# Patient Record
Sex: Male | Born: 1980 | Race: Black or African American | Hispanic: No | Marital: Single | Smoking: Current every day smoker
Health system: Southern US, Community
[De-identification: ages and names within clinical notes are randomized; demographics above are authoritative.]

## PROBLEM LIST (undated history)

## (undated) DIAGNOSIS — G35 Multiple sclerosis: Secondary | ICD-10-CM

## (undated) HISTORY — PX: APPENDECTOMY: SHX54

---

## 2006-12-01 ENCOUNTER — Emergency Department: Payer: Self-pay | Admitting: Emergency Medicine

## 2011-12-23 ENCOUNTER — Emergency Department: Payer: Self-pay | Admitting: Emergency Medicine

## 2013-02-11 ENCOUNTER — Emergency Department: Payer: Self-pay | Admitting: Emergency Medicine

## 2013-02-11 LAB — COMPREHENSIVE METABOLIC PANEL
Albumin: 4.3 g/dL (ref 3.4–5.0)
BUN: 12 mg/dL (ref 7–18)
Chloride: 108 mmol/L — ABNORMAL HIGH (ref 98–107)
Creatinine: 0.87 mg/dL (ref 0.60–1.30)
EGFR (Non-African Amer.): >60
Glucose: 82 mg/dL (ref 65–99)
Osmolality: 282 (ref 275–301)
Sodium: 142 mmol/L (ref 136–145)
Total Protein: 7.6 g/dL (ref 6.4–8.2)

## 2013-02-11 LAB — URINALYSIS, COMPLETE
Glucose,UR: NEGATIVE mg/dL (ref 0–75)
Leukocyte Esterase: NEGATIVE
Protein: NEGATIVE
RBC,UR: 1 /HPF (ref 0–5)
Specific Gravity: 1.026 (ref 1.003–1.030)
Squamous Epithelial: <1
WBC UR: 2 /HPF (ref 0–5)

## 2013-02-11 LAB — CBC
MCH: 30.1 pg (ref 26.0–34.0)
MCHC: 34.2 g/dL (ref 32.0–36.0)
MCV: 88 fL (ref 80–100)
Platelet: 249 10*3/uL (ref 150–440)
RBC: 4.94 10*6/uL (ref 4.40–5.90)
RDW: 13.2 % (ref 11.5–14.5)

## 2013-02-11 LAB — LIPASE, BLOOD: Lipase: 80 U/L (ref 73–393)

## 2013-10-18 LAB — CBC WITH DIFFERENTIAL/PLATELET
Basophil #: 0 10*3/uL (ref 0.0–0.1)
Basophil %: 0.3 %
Eosinophil #: 0.1 10*3/uL (ref 0.0–0.7)
Eosinophil %: 0.9 %
HCT: 39.5 % — ABNORMAL LOW (ref 40.0–52.0)
HGB: 13.6 g/dL (ref 13.0–18.0)
Lymphocyte #: 1.6 10*3/uL (ref 1.0–3.6)
Lymphocyte %: 14.5 %
MCH: 30.3 pg (ref 26.0–34.0)
MCHC: 34.3 g/dL (ref 32.0–36.0)
MCV: 88 fL (ref 80–100)
MONO ABS: 0.6 x10 3/mm (ref 0.2–1.0)
Monocyte %: 5.3 %
Neutrophil #: 8.6 10*3/uL — ABNORMAL HIGH (ref 1.4–6.5)
Neutrophil %: 79 %
Platelet: 224 10*3/uL (ref 150–440)
RBC: 4.48 10*6/uL (ref 4.40–5.90)
RDW: 13 % (ref 11.5–14.5)
WBC: 10.9 10*3/uL — AB (ref 3.8–10.6)

## 2013-10-18 LAB — COMPREHENSIVE METABOLIC PANEL
ALBUMIN: 3.8 g/dL (ref 3.4–5.0)
ALK PHOS: 61 U/L
ANION GAP: 7 (ref 7–16)
BUN: 15 mg/dL (ref 7–18)
Bilirubin,Total: 0.3 mg/dL (ref 0.2–1.0)
CHLORIDE: 103 mmol/L (ref 98–107)
CREATININE: 0.92 mg/dL (ref 0.60–1.30)
Calcium, Total: 8.2 mg/dL — ABNORMAL LOW (ref 8.5–10.1)
Co2: 22 mmol/L (ref 21–32)
EGFR (African American): >60
EGFR (Non-African Amer.): >60
Glucose: 195 mg/dL — ABNORMAL HIGH (ref 65–99)
OSMOLALITY: 271 (ref 275–301)
Potassium: 3.4 mmol/L — ABNORMAL LOW (ref 3.5–5.1)
SGOT(AST): 22 U/L (ref 15–37)
SGPT (ALT): 29 U/L (ref 12–78)
Sodium: 132 mmol/L — ABNORMAL LOW (ref 136–145)
Total Protein: 6.9 g/dL (ref 6.4–8.2)

## 2013-10-18 LAB — LIPASE, BLOOD: Lipase: 75 U/L (ref 73–393)

## 2013-10-19 ENCOUNTER — Observation Stay: Payer: Self-pay | Admitting: Surgery

## 2013-10-19 LAB — URINALYSIS, COMPLETE
BILIRUBIN, UR: NEGATIVE
Bacteria: NONE SEEN
Blood: NEGATIVE
Glucose,UR: NEGATIVE mg/dL (ref 0–75)
Ketone: NEGATIVE
Leukocyte Esterase: NEGATIVE
Nitrite: NEGATIVE
PH: 6 (ref 4.5–8.0)
PROTEIN: NEGATIVE
RBC,UR: NONE SEEN /HPF (ref 0–5)
SPECIFIC GRAVITY: 1.009 (ref 1.003–1.030)
Squamous Epithelial: <1
WBC UR: <1 /HPF (ref 0–5)

## 2013-10-20 LAB — CBC WITH DIFFERENTIAL/PLATELET
Basophil #: 0 10*3/uL (ref 0.0–0.1)
Basophil %: 0.1 %
EOS PCT: 0.1 %
Eosinophil #: 0 10*3/uL (ref 0.0–0.7)
HCT: 36.9 % — ABNORMAL LOW (ref 40.0–52.0)
HGB: 12.9 g/dL — ABNORMAL LOW (ref 13.0–18.0)
Lymphocyte #: 2.5 10*3/uL (ref 1.0–3.6)
Lymphocyte %: 13.1 %
MCH: 30.3 pg (ref 26.0–34.0)
MCHC: 34.8 g/dL (ref 32.0–36.0)
MCV: 87 fL (ref 80–100)
Monocyte #: 1.5 x10 3/mm — ABNORMAL HIGH (ref 0.2–1.0)
Monocyte %: 7.6 %
NEUTROS PCT: 79.1 %
Neutrophil #: 15.4 10*3/uL — ABNORMAL HIGH (ref 1.4–6.5)
Platelet: 211 10*3/uL (ref 150–440)
RBC: 4.24 10*6/uL — AB (ref 4.40–5.90)
RDW: 13.1 % (ref 11.5–14.5)
WBC: 19.4 10*3/uL — ABNORMAL HIGH (ref 3.8–10.6)

## 2013-10-24 LAB — PATHOLOGY REPORT

## 2015-01-31 NOTE — H&P (Signed)
Subjective/Chief Complaint 24 hours of abdominal pain   History of Present Illness 34 y/o male awoke Friday am with vague periumbilical pain followed by nausea, no emesis and migration of pain into RLQ.  no anorexia, no sick contacts, no fevers but feels chills.  In April 2014 had similar but less intense symptoms and CT scan done at [facility] showed dilated appendix and his pain at that time resolved in 3-4 days following antibiotics.   Past History None   Past Med/Surgical Hx:  None, patient reports no medical history.:   ALLERGIES:  No Known Allergies:    Medications takes no presecription meds,  occasional ibuprofen.   Family and Social History:  Family History Non-Contributory   Social History positive  tobacco, positive tobacco (Greater than 1 year), negative ETOH   + Tobacco Current (within 1 year)   Place of Living Home   Review of Systems:  Subjective/Chief Complaint see above   Fever/Chills Yes   Cough No   Sputum No   Abdominal Pain Yes   Diarrhea No   Constipation No   Nausea/Vomiting Yes   Physical Exam:  GEN no acute distress, temp 98 p 77 125/65 BMI 27.8   HEENT pale conjunctivae, PERRL   NECK supple   RESP normal resp effort  clear BS   CARD regular rate   ABD positive tenderness  no liver/spleen enlargement  no hernia  soft  rovsings sign positive, focal peritoneal signs over RLQ.   LYMPH negative neck   EXTR negative cyanosis/clubbing   SKIN normal to palpation   NEURO cranial nerves intact   PSYCH A+O to time, place, person   Lab Results:  Hepatic:  09-Jan-15 22:16   Bilirubin, Total 0.3  Alkaline Phosphatase 61 (45-117 NOTE: New Reference Range 08/30/13)  SGPT (ALT) 29  SGOT (AST) 22  Total Protein, Serum 6.9  Albumin, Serum 3.8  Routine Chem:  09-Jan-15 22:16   Glucose, Serum  195  BUN 15  Creatinine (comp) 0.92  Sodium, Serum  132  Potassium, Serum  3.4  Chloride, Serum 103  CO2, Serum 22  Calcium (Total), Serum   8.2  Osmolality (calc) 271  eGFR (African American) >60  eGFR (Non-African American) >60 (eGFR values <60mL/min/1.73 m2 may be an indication of chronic kidney disease (CKD). Calculated eGFR is useful in patients with stable renal function. The eGFR calculation will not be reliable in acutely ill patients when serum creatinine is changing rapidly. It is not useful in  patients on dialysis. The eGFR calculation may not be applicable to patients at the low and high extremes of body sizes, pregnant women, and vegetarians.)  Anion Gap 7  Lipase 75 (Result(s) reported on 18 Oct 2013 at 10:45PM.)  Routine UA:  10-Jan-15 02:56   Color (UA) Straw  Clarity (UA) Clear  Glucose (UA) Negative  Bilirubin (UA) Negative  Ketones (UA) Negative  Specific Gravity (UA) 1.009  Blood (UA) Negative  pH (UA) 6.0  Protein (UA) Negative  Nitrite (UA) Negative  Leukocyte Esterase (UA) Negative (Result(s) reported on 19 Oct 2013 at 04:25AM.)  RBC (UA) NONE SEEN  WBC (UA) <1 /HPF  Bacteria (UA) NONE SEEN  Epithelial Cells (UA) <1 /HPF  Mucous (UA) PRESENT (Result(s) reported on 19 Oct 2013 at 04:25AM.)  Routine Hem:  09-Jan-15 22:16   WBC (CBC)  10.9  RBC (CBC) 4.48  Hemoglobin (CBC) 13.6  Hematocrit (CBC)  39.5  Platelet Count (CBC) 224  MCV 88  MCH 30.3  MCHC  34.3  RDW 13.0  Neutrophil % 79.0  Lymphocyte % 14.5  Monocyte % 5.3  Eosinophil % 0.9  Basophil % 0.3  Neutrophil #  8.6  Lymphocyte # 1.6  Monocyte # 0.6  Eosinophil # 0.1  Basophil # 0.0 (Result(s) reported on 18 Oct 2013 at 10:32PM.)   Radiology Results: LabUnknown:    10-Jan-15 04:00, CT Abdomen and Pelvis With Contrast  PACS Image  CT:  CT Abdomen and Pelvis With Contrast  REASON FOR EXAM:    (1) rlq pain, tender x 24 hrs; (2) rlq pain, tender x   24 hrs;    NOTE: Nursing t  COMMENTS:   May transport without cardiac monitor    PROCEDURE: CT  - CT ABDOMEN / PELVIS  W  - Oct 19 2013  4:00AM     CLINICAL DATA:  Right  lower quadrant pain and tenderness for 24 hr.    EXAM:  CT ABDOMEN AND PELVIS WITH CONTRAST    TECHNIQUE:  Multidetector CT imaging of the abdomen and pelvis was performed  using the standard protocol following bolus administration of  intravenous contrast.  CONTRAST:  125 cc Isovue 370.    COMPARISON:  None.    FINDINGS:  Mild dependent atelectasis is seen in the right lung base. No  pleural or pericardial effusion.    The gallbladder, liver, spleen, adrenal glands, pancreas and kidneys  are unremarkable.    The appendix is dilated with periappendiceal inflammatory change and  an appendicolith with consistent with acute appendicitis. No abscess  identified. The stomach, small bowel and colon appear normal. No  lymphadenopathy or fluid is identified. There is no focal bony  abnormality.     IMPRESSION:  Study is positive for acute appendicitis without abscess.    Critical Value/emergent results were called by telephone at the time  of interpretation on 10/19/2013 at 4:02 AM to Dr. Carrie Mew ,  who verbally acknowledged these results.      Electronically Signed    By: Inge Rise M.D.    On: 10/19/2013 04:04       Verified By: Ramond Dial, M.D.,    Assessment/Admission Diagnosis 34 y/o male with acute appendicitis   Plan admit, laparoscopic appendectomy later this am.  Explained procedure and that Dr Burt Knack will assume his care later this am. all questions addressed.   Electronic Signatures: Sherri Rad (MD)  (Signed 10-Jan-15 06:30)  Authored: CHIEF COMPLAINT and HISTORY, PAST MEDICAL/SURGIAL HISTORY, ALLERGIES, HOME MEDICATIONS, OTHER MEDICATIONS, FAMILY AND SOCIAL HISTORY, REVIEW OF SYSTEMS, PHYSICAL EXAM, LABS, Radiology, ASSESSMENT AND PLAN   Last Updated: 10-Jan-15 06:30 by Sherri Rad (MD)

## 2015-01-31 NOTE — Op Note (Signed)
PATIENT NAME:  Dennis Cruz, Dennis Cruz MR#:  [id number] DATE OF BIRTH:  09-16-1981  DATE OF PROCEDURE:  10/19/2013  PREOPERATIVE DIAGNOSIS: Acute appendicitis.   POSTOPERATIVE DIAGNOSIS: Acute gangrenous appendicitis.   PROCEDURE: Laparoscopic appendectomy.   SURGEON: Richard E. Excell Seltzer, MD  ANESTHESIA: General with endotracheal tube.   INDICATIONS: This is a patient with right lower quadrant pain and tenderness with a workup showing appendicitis. Preoperatively, we discussed the rationale for surgery, the options of observation, risk of bleeding, infection, recurrence of symptoms, failure to resolve his symptoms, open procedure and negative laparoscopy. This was reviewed for him. He understood and agreed to proceed.   FINDINGS: Acute appendicitis at the tip, gangrenous, but not ruptured.   DESCRIPTION OF PROCEDURE: The patient was induced to general anesthesia, given IV antibiotics. VTE prophylaxis was in place. He was prepped and draped in a sterile fashion. Marcaine was infiltrated in skin and subcutaneous tissues around the periumbilical area. An incision was made. Veress needle was placed. Pneumoperitoneum was obtained, and a 5 mm trocar port was placed. The abdominal cavity was explored, and under direct vision, a 12 mm left lateral port site was placed as well as a 5 mm suprapubic port. The appendix was identified in the right lower quadrant, elevated. The base of the appendix was handled with an Endo GIA standard load, and then 2 firings of the vascular load Endo GIA were fired across the mesoappendix, and the specimen passed out through the lateral port site with the aid of an Endo Catch bag. The area was irrigated with copious amounts of normal saline. Hemostasis was adequate. There was no sign of bleeding or bowel injury. Therefore, the left lateral port site was closed with multiple simple sutures of 0 Vicryl, utilizing an Endo Close technique. Again, the hemostasis was adequate. Pneumoperitoneum  was released. All ports were removed. The 4-0 subcuticular Monocryl was used on all skin edges. Steri-Strips, Mastisol and sterile dressings were placed.   The patient tolerated the procedure well. There were no complications. He was taken to the recovery room in stable condition to be admitted for continued care.   ____________________________ Adah Salvage. Excell Seltzer, MD rec:lb D: 10/19/2013 11:16:21 ET T: 10/19/2013 11:40:47 ET JOB#: [id number]  cc: Adah Salvage. Excell Seltzer, MD, <Dictator> Lattie Haw MD ELECTRONICALLY SIGNED 10/23/2013 11:46

## 2015-07-01 ENCOUNTER — Emergency Department
Admission: EM | Admit: 2015-07-01 | Discharge: 2015-07-01 | Disposition: A | Discharge status: Home / Self Care | Payer: 59 | Attending: Emergency Medicine | Admitting: Emergency Medicine

## 2015-07-01 DIAGNOSIS — M62830 Muscle spasm of back: Secondary | ICD-10-CM | POA: Diagnosis not present

## 2015-07-01 DIAGNOSIS — J029 Acute pharyngitis, unspecified: Secondary | ICD-10-CM | POA: Insufficient documentation

## 2015-07-01 DIAGNOSIS — H9201 Otalgia, right ear: Secondary | ICD-10-CM | POA: Diagnosis not present

## 2015-07-01 LAB — POCT RAPID STREP A: Streptococcus, Group A Screen (Direct): NEGATIVE

## 2015-07-01 MED ORDER — MELOXICAM 15 MG PO TABS: 15.0000 mg | ORAL_TABLET | Freq: Every day | ORAL | Status: AC

## 2015-07-01 MED ORDER — CYCLOBENZAPRINE HCL 10 MG PO TABS: 10.0000 mg | ORAL_TABLET | Freq: Three times a day (TID) | ORAL | Status: DC | PRN

## 2015-07-01 MED ORDER — MAGIC MOUTHWASH W/LIDOCAINE: 5.0000 mL | Freq: Four times a day (QID) | ORAL | Status: AC

## 2015-07-01 NOTE — Discharge Instructions (Signed)
Sore Throat    A sore throat is pain, burning, irritation, or scratchiness of the throat. There is often pain or tenderness when swallowing or talking. A sore throat may be accompanied by other symptoms, such as coughing, sneezing, fever, and swollen neck glands. A sore throat is often the first sign of another sickness, such as a cold, flu, strep throat, or mononucleosis (commonly known as mono). Most sore throats go away without medical treatment.  CAUSES  The most common causes of a sore throat include:  A viral infection, such as a cold, flu, or mono.  A bacterial infection, such as strep throat, tonsillitis, or whooping cough.  Seasonal allergies.  Dryness in the air.  Irritants, such as smoke or pollution.  Gastroesophageal reflux disease (GERD). HOME CARE INSTRUCTIONS  Only take over-the-counter medicines as directed by your caregiver.  Drink enough fluids to keep your urine clear or pale yellow.  Rest as needed.  Try using throat sprays, lozenges, or sucking on hard candy to ease any pain (if older than 4 years or as directed).  Sip warm liquids, such as broth, herbal tea, or warm water with honey to relieve pain temporarily. You may also eat or drink cold or frozen liquids such as frozen ice pops.  Gargle with salt water (mix 1 tsp salt with 8 oz of water).  Do not smoke and avoid secondhand smoke.  Put a cool-mist humidifier in your bedroom at night to moisten the air. You can also turn on a hot shower and sit in the bathroom with the door closed for 5-10 minutes. SEEK IMMEDIATE MEDICAL CARE IF:  You have difficulty breathing.  You are unable to swallow fluids, soft foods, or your saliva.  You have increased swelling in the throat.  Your sore throat does not get better in 7 days.  You have nausea and vomiting.  You have a fever or persistent symptoms for more than 2-3 days.  You have a fever and your symptoms suddenly get worse. MAKE SURE YOU:  Understand these instructions.    Will watch your condition.  Will get help right away if you are not doing well or get worse. Document Released: 11/03/2004 Document Revised: 09/12/2012 Document Reviewed: 06/03/2012  [facility] Patient Information 2015 [place], [place]. This information is not intended to replace advice given to you by your health care provider. Make sure you discuss any questions you have with your health care provider.   Back Exercises Back exercises help treat and prevent back injuries. The goal of back exercises is to increase the strength of your abdominal and back muscles and the flexibility of your back. These exercises should be started when you no longer have back pain. Back exercises include:  Pelvic Tilt. Lie on your back with your knees bent. Tilt your pelvis until the lower part of your back is against the floor. Hold this position 5 to 10 sec and repeat 5 to 10 times.  Knee to Chest. Pull first 1 knee up against your chest and hold for 20 to 30 seconds, repeat this with the other knee, and then both knees. This may be done with the other leg straight or bent, whichever feels better.  Sit-Ups or Curl-Ups. Bend your knees 90 degrees. Start with tilting your pelvis, and do a partial, slow sit-up, lifting your trunk only 30 to 45 degrees off the floor. Take at least 2 to 3 seconds for each sit-up. Do not do sit-ups with your knees out straight. If partial  sit-ups are difficult, simply do the above but with only tightening your abdominal muscles and holding it as directed.  Hip-Lift. Lie on your back with your knees flexed 90 degrees. Push down with your feet and shoulders as you raise your hips a couple inches off the floor; hold for 10 seconds, repeat 5 to 10 times.  Back arches. Lie on your stomach, propping yourself up on bent elbows. Slowly press on your hands, causing an arch in your low back. Repeat 3 to 5 times. Any initial stiffness and discomfort should lessen with repetition over  time.  Shoulder-Lifts. Lie face down with arms beside your body. Keep hips and torso pressed to floor as you slowly lift your head and shoulders off the floor. Do not overdo your exercises, especially in the beginning. Exercises may cause you some mild back discomfort which lasts for a few minutes; however, if the pain is more severe, or lasts for more than 15 minutes, do not continue exercises until you see your caregiver. Improvement with exercise therapy for back problems is slow.  See your caregivers for assistance with developing a proper back exercise program. Document Released: 11/03/2004 Document Revised: 12/19/2011 Document Reviewed: 07/28/2011 [facility] Patient Information 2015 [place], [place]. This information is not intended to replace advice given to you by your health care provider. Make sure you discuss any questions you have with your health care provider.

## 2015-07-01 NOTE — ED Provider Notes (Signed)
[facility] Emergency Department Provider Note  ____________________________________________  Time seen: Approximately 6:06 PM  I have reviewed the triage vital signs and the nursing notes.   HISTORY  Chief Complaint Sore Throat and Back Pain    HPI Dennis Cruz is a 34 y.o. male sensitivity emergency room with a complaint of 3 days of sore throat and 2 days of lower back pain. He states that the throat pain is described as sharp, constant, and is moderate to severe. He endorses chills but denies fever. No nasal congestion, mild right ear pain, and no cough. He has taken ibuprofen for same minimal relief. He also endorses low back pain bilaterally. Denies any kind of injury. Pain has gradually worsened over the preceding days. No numbness or tingling in lower extremities. The pain does not radiate.   No past medical history on file.  There are no active problems to display for this patient.   No past surgical history on file.  Current Outpatient Rx  Name  Route  Sig  Dispense  Refill  . cyclobenzaprine (FLEXERIL) 10 MG tablet   Oral   Take 1 tablet (10 mg total) by mouth 3 (three) times daily as needed for muscle spasms.   15 tablet   0   . magic mouthwash w/lidocaine SOLN   Oral   Take 5 mLs by mouth 4 (four) times daily.   240 mL   0     1/1/1 on ingredient ratio   . meloxicam (MOBIC) 15 MG tablet   Oral   Take 1 tablet (15 mg total) by mouth daily.   30 tablet   0     Allergies Review of patient's allergies indicates no known allergies.  No family history on file.  Social History Social History  Substance Use Topics  . Smoking status: Not on file  . Smokeless tobacco: Not on file  . Alcohol Use: Not on file    Review of Systems Constitutional: No fever but endorses chills Eyes: No visual changes. ENT: Positive sore throat, mild right ear pain, and no nasal congestion. Difficulty swallowing. Cardiovascular: Denies chest  pain. Respiratory: Denies shortness of breath. Gastrointestinal: No abdominal pain.  No nausea, no vomiting.  No diarrhea.  No constipation. Genitourinary: Negative for dysuria. Musculoskeletal: Negative for back pain. Skin: Negative for rash. Neurological: Negative for headaches, focal weakness or numbness.  10-point ROS otherwise negative.  ____________________________________________   PHYSICAL EXAM:  VITAL SIGNS: ED Triage Vitals  Enc Vitals Group     BP 07/01/15 1710 137/75 mmHg     Pulse Rate 07/01/15 1710 74     Resp 07/01/15 1710 18     Temp 07/01/15 1710 98 F (36.7 C)     Temp Source 07/01/15 1710 Oral     SpO2 07/01/15 1710 97 %     Weight 07/01/15 1710 212 lb (96.163 kg)     Height 07/01/15 1710  (1.88 m)     Head Cir --      Peak Flow --      Pain Score 07/01/15 1711 10     Pain Loc --      Pain Edu? --      Excl. in GC? --     Constitutional: Alert and oriented. Well appearing and in no acute distress. Eyes: Conjunctivae are normal. PERRL. EOMI. Head: Atraumatic. Nose: No congestion/rhinnorhea. Mouth/Throat: Mucous membranes are moist.  Oropharynx non-erythematous. Neck: No stridor.   Hematological/Lymphatic/Immunilogical: Positive for diffuse anterior  cervical lymphadenopathy. Tender to palpation. Mobile Cardiovascular: Normal rate, regular rhythm. Grossly normal heart sounds.  Good peripheral circulation. Respiratory: Normal respiratory effort.  No retractions. Lungs CTAB. Gastrointestinal: Soft and nontender. No distention. No abdominal bruits. No CVA tenderness. Musculoskeletal: No lower extremity tenderness nor edema.  No joint effusions. No visible abnormalities. No tenderness to midline. Diffuse tenderness bilateral paraspinal muscles. Spasms noted in right paraspinal lumbar muscles. Negative straight leg raise. Neurologic:  Normal speech and language. No gross focal neurologic deficits are appreciated. No gait instability. Skin:  Skin is warm,  dry and intact. No rash noted. Psychiatric: Mood and affect are normal. Speech and behavior are normal.  ____________________________________________   LABS (all labs ordered are listed, but only abnormal results are displayed)  Labs Reviewed  POCT RAPID STREP A    Negative rapid strep ____________________________________________  EKG   ____________________________________________  RADIOLOGY   ____________________________________________   PROCEDURES  Procedure(s) performed: None  Critical Care performed: No  ____________________________________________   INITIAL IMPRESSION / ASSESSMENT AND PLAN / ED COURSE  Pertinent labs & imaging results that were available during my care of the patient were reviewed by me and considered in my medical decision making (see chart for details).  Patient presented to the ED with complaint of sore throat and low back pain. Patient's symptoms, history, and exam are most consistent with pharyngitis and lumbar paraspinal muscle spasm. Discussed with patient lab results and diagnosis. He was understanding. States compliance with treatment regimen. ____________________________________________   FINAL CLINICAL IMPRESSION(S) / ED DIAGNOSES  Final diagnoses:  Pharyngitis  Lumbar paraspinal muscle spasm      Racheal Patches, PA-C 07/01/15 1857  Sharyn Creamer, MD 07/01/15 2217

## 2015-07-01 NOTE — ED Notes (Signed)
Pt ambulatory to triage with reports of sore throat without fever since Sunday and lower back pain that began on Monday without injury.

## 2015-09-29 ENCOUNTER — Encounter: Payer: Self-pay | Admitting: Emergency Medicine

## 2015-09-29 ENCOUNTER — Emergency Department: Payer: 59

## 2015-09-29 ENCOUNTER — Emergency Department
Admission: EM | Admit: 2015-09-29 | Discharge: 2015-09-29 | Disposition: A | Discharge status: Home / Self Care | Payer: 59 | Attending: Emergency Medicine | Admitting: Emergency Medicine

## 2015-09-29 DIAGNOSIS — S39012A Strain of muscle, fascia and tendon of lower back, initial encounter: Secondary | ICD-10-CM

## 2015-09-29 DIAGNOSIS — Y9289 Other specified places as the place of occurrence of the external cause: Secondary | ICD-10-CM | POA: Diagnosis not present

## 2015-09-29 DIAGNOSIS — X58XXXA Exposure to other specified factors, initial encounter: Secondary | ICD-10-CM | POA: Insufficient documentation

## 2015-09-29 DIAGNOSIS — Y998 Other external cause status: Secondary | ICD-10-CM | POA: Diagnosis not present

## 2015-09-29 DIAGNOSIS — F17200 Nicotine dependence, unspecified, uncomplicated: Secondary | ICD-10-CM | POA: Diagnosis not present

## 2015-09-29 DIAGNOSIS — Z79899 Other long term (current) drug therapy: Secondary | ICD-10-CM | POA: Diagnosis not present

## 2015-09-29 DIAGNOSIS — Y9389 Activity, other specified: Secondary | ICD-10-CM | POA: Insufficient documentation

## 2015-09-29 DIAGNOSIS — S3992XA Unspecified injury of lower back, initial encounter: Secondary | ICD-10-CM | POA: Diagnosis present

## 2015-09-29 MED ORDER — NAPROXEN 500 MG PO TABS: 500.0000 mg | ORAL_TABLET | Freq: Two times a day (BID) | ORAL | Status: AC

## 2015-09-29 MED ORDER — CYCLOBENZAPRINE HCL 10 MG PO TABS: 10.0000 mg | ORAL_TABLET | Freq: Every day | ORAL | Status: AC

## 2015-09-29 NOTE — ED Notes (Signed)
Discussed discharge instructions, prescriptions, and follow-up care with patient. No questions or concerns at this time. Pt stable at discharge.  

## 2015-09-29 NOTE — ED Provider Notes (Signed)
CSN: [id number]     Arrival date & time 09/29/15  1436 History   First MD Initiated Contact with Patient 09/29/15 1449     Chief Complaint  Patient presents with  . Back Pain      HPI Comments: 34 year old male presents today complaining of low back pain for the past 3 days. He does not recall an injury. No pain, tingling or numbness down his legs. He has been taking motrin without relief of pain. Was seen for the same about 3 months ago and prescribe mobic and flexeril, no imaging was performed. No history of low back problems or back surgery. No loss of bowel or bladder function.   The history is provided by the patient.    History reviewed. No pertinent past medical history. History reviewed. No pertinent past surgical history. History reviewed. No pertinent family history. Social History  Substance Use Topics  . Smoking status: Current Every Day Smoker  . Smokeless tobacco: None  . Alcohol Use: Yes    Review of Systems  Musculoskeletal: Positive for back pain and arthralgias. Negative for myalgias and gait problem.  Neurological: Negative for weakness and numbness.  All other systems reviewed and are negative.     Allergies  Review of patient's allergies indicates no known allergies.  Home Medications   Prior to Admission medications   Medication Sig Start Date End Date Taking? Authorizing Provider  cyclobenzaprine (FLEXERIL) 10 MG tablet Take 1 tablet (10 mg total) by mouth at bedtime. 09/29/15   Christella Scheuermann, PA-C  magic mouthwash w/lidocaine SOLN Take 5 mLs by mouth 4 (four) times daily. 07/01/15   Delorise Royals Cuthriell, PA-C  meloxicam (MOBIC) 15 MG tablet Take 1 tablet (15 mg total) by mouth daily. 07/01/15   Delorise Royals Cuthriell, PA-C  naproxen (NAPROSYN) 500 MG tablet Take 1 tablet (500 mg total) by mouth 2 (two) times daily with a meal. 09/29/15   Christella Scheuermann, PA-C   BP 126/73 mmHg  Pulse 64  Temp(Src) 98.2 F (36.8 C) (Oral)  Resp 20  Ht 6\' 1"  (1.854  m)  Wt 96.163 kg  BMI 27.98 kg/m2  SpO2 98% Physical Exam  Constitutional: He is oriented to person, place, and time. Vital signs are normal. He appears well-developed and well-nourished. He is active.  Non-toxic appearance. He does not have a sickly appearance. He does not appear ill.  HENT:  Head: Normocephalic and atraumatic.  Musculoskeletal: Normal range of motion. He exhibits tenderness.  Neurological: He is alert and oriented to person, place, and time. He has normal strength. He displays abnormal reflex. No sensory deficit. He exhibits normal muscle tone. Gait normal.  Reflex Scores:      Patellar reflexes are 2+ on the right side and 2+ on the left side. Bilateral LE strength 5/5   Skin: Skin is warm and dry.  Psychiatric: He has a normal mood and affect. His behavior is normal. Judgment and thought content normal.  Nursing note and vitals reviewed.   ED Course  Procedures (including critical care time) Labs Review Labs Reviewed - No data to display  Imaging Review Dg Lumbar Spine Complete  09/29/2015  CLINICAL DATA:  Lumbago for 2 days with intermittent left-sided radicular symptoms EXAM: LUMBAR SPINE - COMPLETE 4+ VIEW COMPARISON:  None. FINDINGS: Frontal, lateral, spot lumbosacral lateral, and bilateral oblique views were obtained. The there are 5 non-rib-bearing lumbar type vertebral bodies. There is slight thoracolumbar dextroscoliosis. There is no fracture or spondylolisthesis. Disc  spaces appear normal. There is no appreciable facet osteoarthritic arthropathy on the oblique views. IMPRESSION: Slight scoliosis. No fracture or spondylolisthesis. No appreciable arthropathy. Electronically Signed   By: Bretta Bang III M.D.   On: 09/29/2015 15:57   I have personally reviewed and evaluated these images and lab results as part of my medical decision-making.   EKG Interpretation None      I independently reviewed and interpreted lumbar spine XRAY, no evidence of  fracture or degenerative change  MDM  Flexeril and Napoxen given to patient Advised to establish with a PCP for followup, may need physical therapy Final diagnoses:  Lumbar strain, initial encounter        Christella Scheuermann, PA-C 09/29/15 1600  Emily Filbert, MD 09/30/15 [phone]

## 2015-09-29 NOTE — Discharge Instructions (Signed)

## 2015-09-29 NOTE — ED Notes (Addendum)
Pt to ed with c/o lower back pain since Sunday, denies injury.  Denies difficulty or pain with urination.  Pt alert and oriented. Reports pain worse with movement.

## 2016-09-29 IMAGING — CR DG LUMBAR SPINE COMPLETE 4+V
5 series · 5 of 5 positions shown · non-contrast
Comparison: None.

CLINICAL DATA: Lumbago for 2 days with intermittent left-sided
radicular symptoms

EXAM:
LUMBAR SPINE - COMPLETE 4+ VIEW

[l-spine ap]
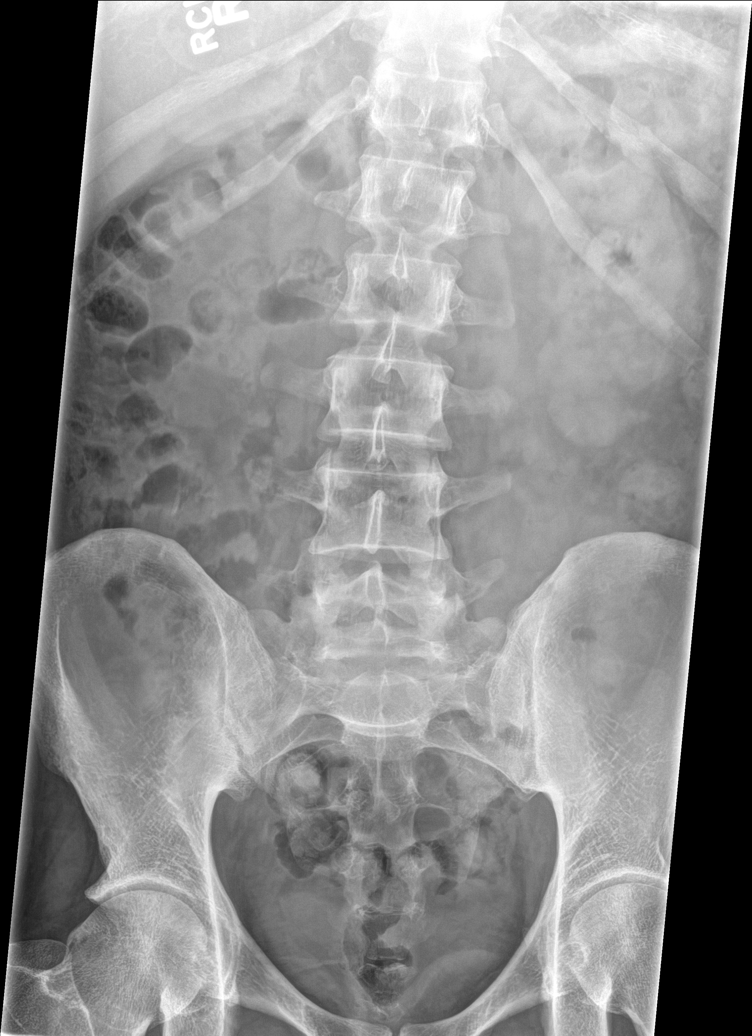

[l-spine obl (1 of 2)]
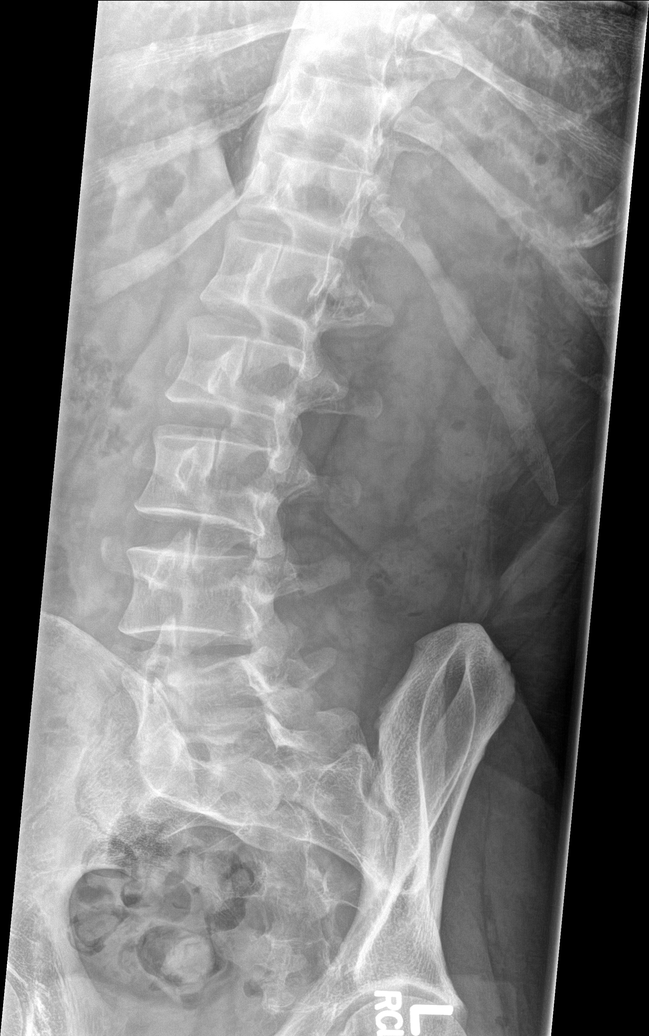

[l-spine obl (2 of 2)]
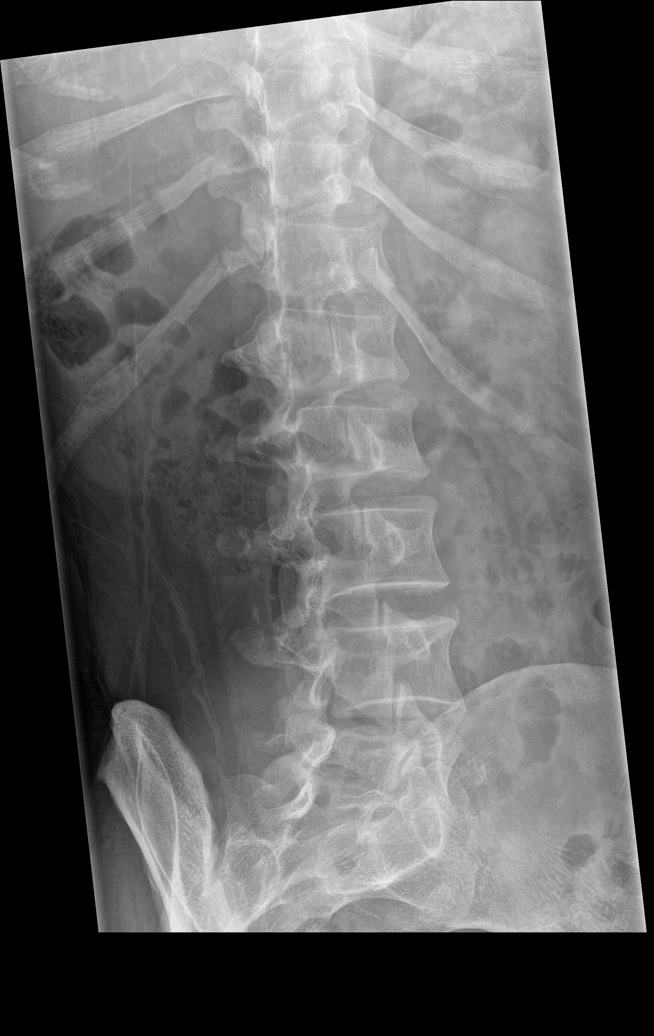

[l-spine lat]
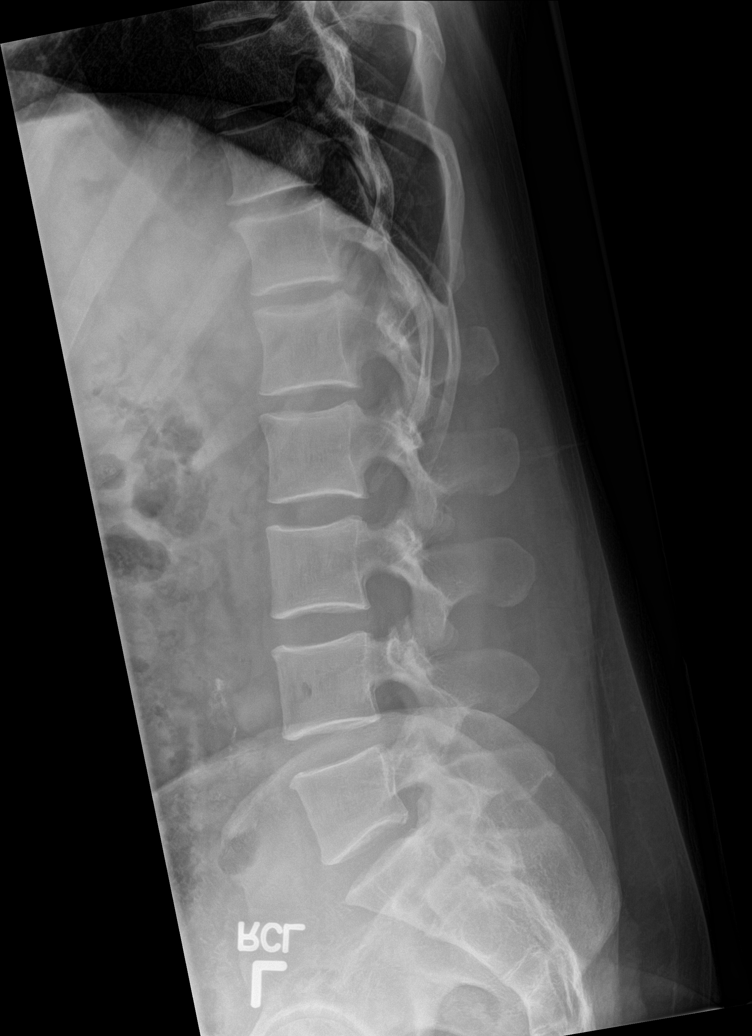

[l-spine spot]
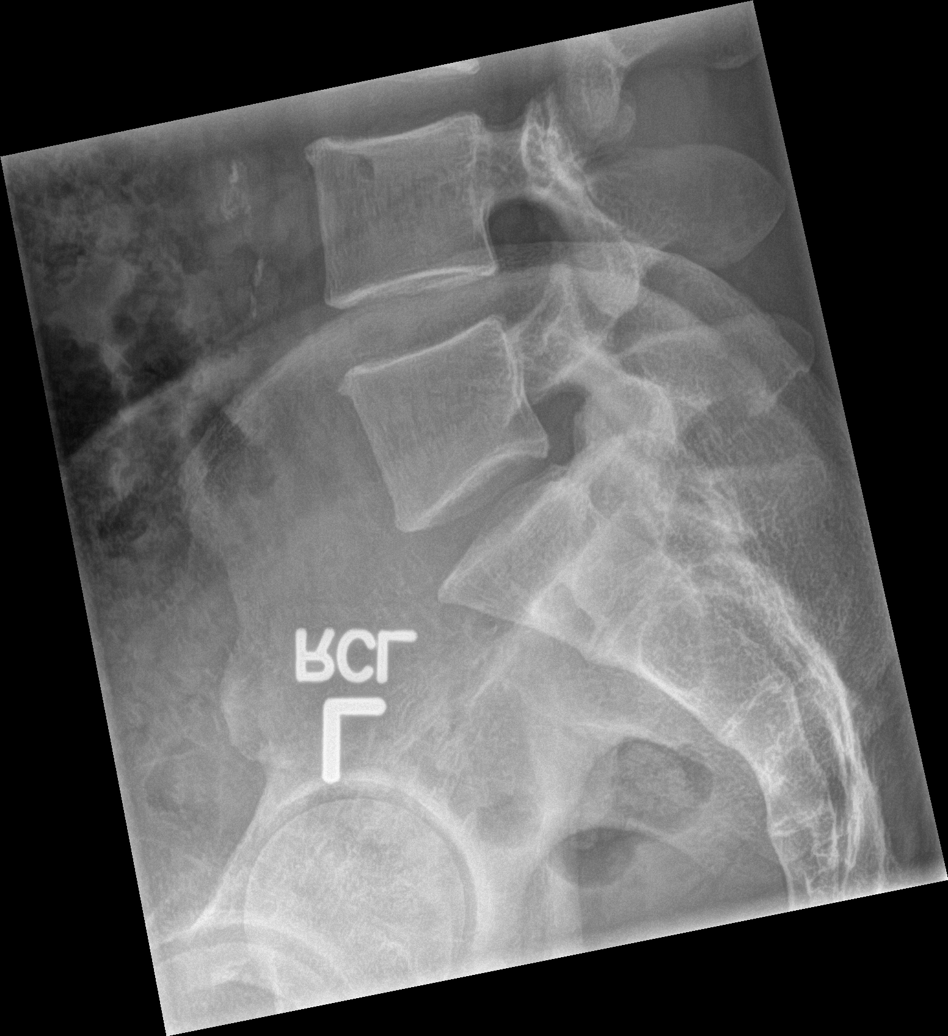

[5 of 5 positions shown; findings below may reference images not displayed]

FINDINGS: Frontal, lateral, spot lumbosacral lateral, and bilateral oblique
views were obtained. The there are 5 non-rib-bearing lumbar type
vertebral bodies. There is slight thoracolumbar dextroscoliosis.
There is no fracture or spondylolisthesis. Disc spaces appear
normal. There is no appreciable facet osteoarthritic arthropathy on
the oblique views.
IMPRESSION: Slight scoliosis. No fracture or spondylolisthesis. No appreciable
arthropathy.

## 2017-05-17 ENCOUNTER — Emergency Department
Admission: EM | Admit: 2017-05-17 | Discharge: 2017-05-17 | Disposition: A | Discharge status: Home / Self Care | Payer: 59 | Attending: Emergency Medicine | Admitting: Emergency Medicine

## 2017-05-17 ENCOUNTER — Encounter: Payer: Self-pay | Admitting: Emergency Medicine

## 2017-05-17 DIAGNOSIS — R519 Headache, unspecified: Secondary | ICD-10-CM

## 2017-05-17 DIAGNOSIS — Z79899 Other long term (current) drug therapy: Secondary | ICD-10-CM | POA: Insufficient documentation

## 2017-05-17 DIAGNOSIS — G35 Multiple sclerosis: Secondary | ICD-10-CM | POA: Insufficient documentation

## 2017-05-17 DIAGNOSIS — F17210 Nicotine dependence, cigarettes, uncomplicated: Secondary | ICD-10-CM | POA: Insufficient documentation

## 2017-05-17 DIAGNOSIS — R51 Headache: Secondary | ICD-10-CM | POA: Insufficient documentation

## 2017-05-17 HISTORY — DX: Multiple sclerosis: G35

## 2017-05-17 MED ORDER — DIPHENHYDRAMINE HCL 50 MG/ML IJ SOLN
INTRAMUSCULAR | Status: AC
  Filled 2017-05-17: qty 1

## 2017-05-17 MED ORDER — METOCLOPRAMIDE HCL 5 MG/ML IJ SOLN
INTRAMUSCULAR | Status: AC
  Filled 2017-05-17: qty 2

## 2017-05-17 MED ORDER — KETOROLAC TROMETHAMINE 30 MG/ML IJ SOLN
INTRAMUSCULAR | Status: AC
  Filled 2017-05-17: qty 1

## 2017-05-17 NOTE — ED Provider Notes (Signed)
[facility] Emergency Department Provider Note  Time seen: 5:18 PM  I have reviewed the triage vital signs and the nursing notes.   HISTORY  Chief Complaint Headache    HPI Dennis Cruz is a 36 y.o. male with a past medical history multiple sclerosis recently diagnosed 5 months ago who presents to the emergency department for right-sided headache. According to the patient for the past 2-3 years she has been having intermittent right-sided headaches. He states they usually come and go lasting a day at most. He states over the past 3-4 days he has had a fairly constant right-sided headache. Has taken over-the-counter medications without relief. Denies any weakness, numbness, slurred speech, confusion. Denies any photophobia. Denies any fever. Describes a headache as moderate in severity, dull aching pain in the right side.  Past Medical History:  Diagnosis Date  . Multiple sclerosis (HCC)     There are no active problems to display for this patient.   Past Surgical History:  Procedure Laterality Date  . APPENDECTOMY      Prior to Admission medications   Medication Sig Start Date End Date Taking? Authorizing Provider  cyclobenzaprine (FLEXERIL) 10 MG tablet Take 1 tablet (10 mg total) by mouth at bedtime. 09/29/15   Christella Scheuermann, PA-C  magic mouthwash w/lidocaine SOLN Take 5 mLs by mouth 4 (four) times daily. 07/01/15   Cuthriell, Delorise Royals, PA-C  meloxicam (MOBIC) 15 MG tablet Take 1 tablet (15 mg total) by mouth daily. 07/01/15   Cuthriell, Delorise Royals, PA-C  naproxen (NAPROSYN) 500 MG tablet Take 1 tablet (500 mg total) by mouth 2 (two) times daily with a meal. 09/29/15   Christella Scheuermann, PA-C    No Known Allergies  No family history on file.  Social History Social History  Substance Use Topics  . Smoking status: Current Every Day Smoker  . Smokeless tobacco: Never Used  . Alcohol use Yes    Review of Systems Constitutional: Negative for  fever. Cardiovascular: Negative for chest pain. Respiratory: Negative for shortness of breath. Gastrointestinal: Negative for abdominal pain Musculoskeletal: Negative for back pain.Negative for neck pain. Neurological: Moderate headache. Denies focal weakness or numbness. All other ROS negative  ____________________________________________   PHYSICAL EXAM:  VITAL SIGNS: ED Triage Vitals  Enc Vitals Group     BP 05/17/17 1248 (!) 114/103     Pulse Rate 05/17/17 1248 78     Resp 05/17/17 1248 18     Temp 05/17/17 1248 97.8 F (36.6 C)     Temp Source 05/17/17 1248 Oral     SpO2 05/17/17 1248 99 %     Weight 05/17/17 1249 198 lb (89.8 kg)     Height 05/17/17 1249 6\' 1"  (1.854 m)     Head Circumference --      Peak Flow --      Pain Score 05/17/17 1253 10     Pain Loc --      Pain Edu? --      Excl. in GC? --     Constitutional: Alert and oriented. Well appearing and in no distress. Eyes: Normal exam ENT   Head: Normocephalic and atraumatic.   Mouth/Throat: Mucous membranes are moist. Cardiovascular: Normal rate, regular rhythm. No murmur Respiratory: Normal respiratory effort without tachypnea nor retractions. Breath sounds are clear  Gastrointestinal: Soft and nontender. No distention. Musculoskeletal: Nontender with normal range of motion in all extremities.  Neurologic:  Normal speech and language. No gross focal neurologic  deficits. Equal grip strength bilaterally. No pronator drift. 5/5 motor in extremities. Cranial nerves intact. Patient states mild right-sided weakness since his diagnosis of multiple sclerosis however on exam strength appears equal. Skin:  Skin is warm, dry and intact.  Psychiatric: Mood and affect are normal. Speech and behavior are normal.   ____________________________________________    INITIAL IMPRESSION / ASSESSMENT AND PLAN / ED COURSE  Pertinent labs & imaging results that were available during my care of the patient were reviewed  by me and considered in my medical decision making (see chart for details).  Patient presents to the emergency department with right-sided headache ongoing for the past 3-4 days. History of right-sided headaches in the past however this is longer lasting. No neurological deficits. Overall the patient appears very well with a normal/intact neurological exam. No distress. We will attempt to treat headache with medications and monitor for improvement  Patient states complete resolution of headache after medications and fluids. We will discharge home with PCP/neurology follow-up. Patient agreeable to plan.  ____________________________________________   FINAL CLINICAL IMPRESSION(S) / ED DIAGNOSES  Headache    Minna Antis, MD 05/17/17 1721

## 2017-05-17 NOTE — Discharge Instructions (Signed)
Downtime discharge paperwork

## 2017-05-17 NOTE — ED Triage Notes (Signed)
States he hasn't been to work since Monday due to headaches.  Diagnosed with MS in March and is taking betaferrin every other night.  C/O pain behind right eye.  Denies photosensitivity. Patient has tried ibuprofen to help with pain, but that has been ineffective.

## 2020-01-06 ENCOUNTER — Other Ambulatory Visit: Payer: Self-pay | Admitting: Gastroenterology

## 2020-01-06 DIAGNOSIS — R6881 Early satiety: Secondary | ICD-10-CM

## 2020-01-15 ENCOUNTER — Encounter
Admission: RE | Admit: 2020-01-15 | Discharge: 2020-01-15 | Disposition: A | Discharge status: Home / Self Care | Payer: Medicare HMO | Source: Ambulatory Visit | Attending: Gastroenterology | Admitting: Gastroenterology

## 2020-01-15 ENCOUNTER — Other Ambulatory Visit: Payer: Self-pay

## 2020-01-15 DIAGNOSIS — R6881 Early satiety: Secondary | ICD-10-CM | POA: Diagnosis present

## 2020-01-15 MED ORDER — TECHNETIUM TC 99M SULFUR COLLOID
2.0000 | Freq: Once | INTRAVENOUS | Status: AC | PRN
Start: 2020-01-15 — End: 2020-01-15
  Administered 2020-01-15: 2.46 via ORAL

## 2024-08-23 ENCOUNTER — Ambulatory Visit: Admit: 2024-08-23 | Admitting: Gastroenterology

## 2024-08-23 SURGERY — COLONOSCOPY
Anesthesia: General
